# Patient Record
Sex: Female | Born: 1970 | Race: White | Hispanic: No | Marital: Single | Smoking: Current every day smoker
Health system: Southern US, Community
[De-identification: ages and names within clinical notes are randomized; demographics above are authoritative.]

## PROBLEM LIST (undated history)

## (undated) DIAGNOSIS — E785 Hyperlipidemia, unspecified: Secondary | ICD-10-CM

## (undated) DIAGNOSIS — I10 Essential (primary) hypertension: Secondary | ICD-10-CM

## (undated) HISTORY — DX: Hyperlipidemia, unspecified: E78.5

## (undated) HISTORY — PX: BREAST ENHANCEMENT SURGERY: SHX7

## (undated) HISTORY — DX: Essential (primary) hypertension: I10

## (undated) HISTORY — PX: SKIN GRAFT: SHX250

---

## 2001-03-11 ENCOUNTER — Encounter: Payer: Self-pay | Admitting: Obstetrics and Gynecology

## 2001-03-11 ENCOUNTER — Encounter: Admission: RE | Admit: 2001-03-11 | Discharge: 2001-03-11 | Payer: Self-pay | Admitting: Obstetrics and Gynecology

## 2006-10-11 ENCOUNTER — Ambulatory Visit (HOSPITAL_COMMUNITY): Admission: RE | Admit: 2006-10-11 | Discharge: 2006-10-11 | Payer: Self-pay | Admitting: Obstetrics and Gynecology

## 2012-06-09 ENCOUNTER — Ambulatory Visit: Admit: 2012-06-09 | Payer: Self-pay | Admitting: Obstetrics & Gynecology

## 2012-06-09 SURGERY — ROBOTIC ASSISTED TOTAL HYSTERECTOMY
Anesthesia: General

## 2013-05-13 ENCOUNTER — Other Ambulatory Visit: Payer: Self-pay | Admitting: Obstetrics & Gynecology

## 2013-05-13 DIAGNOSIS — N6320 Unspecified lump in the left breast, unspecified quadrant: Secondary | ICD-10-CM

## 2013-05-20 ENCOUNTER — Ambulatory Visit
Admission: RE | Admit: 2013-05-20 | Discharge: 2013-05-20 | Disposition: A | Discharge status: Home / Self Care | Payer: BC Managed Care – PPO | Source: Ambulatory Visit | Attending: Obstetrics & Gynecology | Admitting: Obstetrics & Gynecology

## 2013-05-20 DIAGNOSIS — N6320 Unspecified lump in the left breast, unspecified quadrant: Secondary | ICD-10-CM

## 2013-08-07 ENCOUNTER — Encounter (HOSPITAL_COMMUNITY): Payer: Self-pay | Admitting: Emergency Medicine

## 2013-08-07 ENCOUNTER — Emergency Department (HOSPITAL_COMMUNITY)
Admission: EM | Admit: 2013-08-07 | Discharge: 2013-08-08 | Disposition: A | Discharge status: Home / Self Care | Payer: BC Managed Care – PPO | Attending: Emergency Medicine | Admitting: Emergency Medicine

## 2013-08-07 DIAGNOSIS — M5030 Other cervical disc degeneration, unspecified cervical region: Secondary | ICD-10-CM | POA: Insufficient documentation

## 2013-08-07 DIAGNOSIS — M5382 Other specified dorsopathies, cervical region: Secondary | ICD-10-CM | POA: Insufficient documentation

## 2013-08-07 DIAGNOSIS — Z7982 Long term (current) use of aspirin: Secondary | ICD-10-CM | POA: Insufficient documentation

## 2013-08-07 DIAGNOSIS — M79609 Pain in unspecified limb: Secondary | ICD-10-CM | POA: Insufficient documentation

## 2013-08-07 DIAGNOSIS — F17200 Nicotine dependence, unspecified, uncomplicated: Secondary | ICD-10-CM | POA: Insufficient documentation

## 2013-08-07 DIAGNOSIS — R110 Nausea: Secondary | ICD-10-CM | POA: Insufficient documentation

## 2013-08-07 DIAGNOSIS — M5412 Radiculopathy, cervical region: Secondary | ICD-10-CM

## 2013-08-07 LAB — BASIC METABOLIC PANEL
BUN: 10 mg/dL (ref 6–23)
CO2: 22 mEq/L (ref 19–32)
Calcium: 9.2 mg/dL (ref 8.4–10.5)
Chloride: 101 mEq/L (ref 96–112)
Creatinine, Ser: 0.78 mg/dL (ref 0.50–1.10)
GFR calc Af Amer: >90 mL/min (ref >90)
Glucose, Bld: 112 mg/dL — ABNORMAL HIGH (ref 70–99)
Potassium: 3.8 mEq/L (ref 3.7–5.3)
Sodium: 138 mEq/L (ref 137–147)

## 2013-08-07 LAB — CBC
HEMATOCRIT: 40 % (ref 36.0–46.0)
Hemoglobin: 14.5 g/dL (ref 12.0–15.0)
MCH: 33.3 pg (ref 26.0–34.0)
MCHC: 36.3 g/dL — AB (ref 30.0–36.0)
MCV: 92 fL (ref 78.0–100.0)
Platelets: 255 10*3/uL (ref 150–400)
RBC: 4.35 MIL/uL (ref 3.87–5.11)
RDW: 12.5 % (ref 11.5–15.5)
WBC: 7.9 10*3/uL (ref 4.0–10.5)

## 2013-08-07 LAB — I-STAT TROPONIN, ED: Troponin i, poc: 0 ng/mL (ref 0.00–0.08)

## 2013-08-07 MED ORDER — DIAZEPAM 5 MG PO TABS
5.0000 mg | ORAL_TABLET | Freq: Once | ORAL | Status: AC
  Administered 2013-08-08: 5 mg via ORAL
  Filled 2013-08-07: qty 1

## 2013-08-07 MED ORDER — IBUPROFEN 200 MG PO TABS
600.0000 mg | ORAL_TABLET | Freq: Once | ORAL | Status: AC
  Administered 2013-08-08: 600 mg via ORAL
  Filled 2013-08-07 (×2): qty 1

## 2013-08-07 NOTE — ED Provider Notes (Signed)
CSN: [id number]     Arrival date & time 08/07/13  2229 History  This chart was scribed for Junius Creamer, NP, working with Orlie Dakin, MD, by [facility] ED Scribe. This patient was seen in room TR06C/TR06C and the patient's care was started at 11:36 PM.   Chief Complaint  Patient presents with  . Neck Pain  . Arm Pain    The history is provided by the patient. No language interpreter was used.    HPI Comments: Beth Moreno is a 43 y.o. female who presents to the Emergency Department complaining of intermittent, moderate "sharp" neck pain that radiates to her bilateral hands as an "aching" pain onset at 7:00 PM. She states that the pain has mostly resolved in her neck, but that it persists in her arms. She states that she has tried [place], Tylenol and baby aspirin with minimal relief. She states that movement slightly improves her pain, but that there are no other modifying factors. She denies any injury to have onset this pain, although states that she does a lot of typing. She also states that she has had associated intermittent nausea tonight due to this pain. She denies any associated numbness or tingling. She denies any history of neck injuries.Denies, diaphoresis, SOB.   History reviewed. No pertinent past medical history. History reviewed. No pertinent past surgical history. No family history on file. History  Substance Use Topics  . Smoking status: Current Every Day Smoker  . Smokeless tobacco: Not on file  . Alcohol Use: Yes   OB History   Grav Para Term Preterm Abortions TAB SAB Ect Mult Living                 Review of Systems  Constitutional: Negative for fever.  Gastrointestinal: Positive for nausea.  Musculoskeletal: Positive for neck pain (radiates to bilateral arms). Negative for back pain and joint swelling.  Skin: Negative for rash and wound.  Neurological: Negative for dizziness, weakness, light-headedness and numbness.  All other systems reviewed and are  negative.   Allergies  Review of patient's allergies indicates no known allergies.  Home Medications   Current Outpatient Rx  Name  Route  Sig  Dispense  Refill  . acetaminophen (TYLENOL) 325 MG tablet   Oral   Take 325-650 mg by mouth every 6 (six) hours as needed (pain).         Mar[place] Kitchen aspirin EC 81 MG tablet   Oral   Take 81 mg by mouth once. For pain         . diazepam (VALIUM) 5 MG tablet   Oral   Take 1 tablet (5 mg total) by mouth every 8 (eight) hours as needed for anxiety.   30 tablet   0   . naproxen sodium (ANAPROX) 550 MG tablet   Oral   Take 1 tablet (550 mg total) by mouth 3 (three) times daily with meals.   30 tablet   0     Triage Vitals: BP 143/86  Pulse 81  Temp(Src) 97.9 F (36.6 C) (Oral)  Resp 17  Wt 211 lb 2 oz (95.766 kg)  SpO2 96%  Physical Exam  Nursing note and vitals reviewed. Constitutional: She is oriented to person, place, and time. She appears well-developed and well-nourished. No distress.  HENT:  Head: Normocephalic and atraumatic.  Eyes: EOM are normal. Pupils are equal, round, and reactive to light.  Neck: Normal range of motion. Neck supple. Muscular tenderness present. No spinous process tenderness present.  No rigidity. No edema and normal range of motion present.  Cardiovascular: Normal rate.   Pulmonary/Chest: Effort normal. No respiratory distress.  Musculoskeletal: Normal range of motion. She exhibits no edema and no tenderness.  Neurological: She is alert and oriented to person, place, and time.  Skin: Skin is warm and dry.  Psychiatric: She has a normal mood and affect. Her behavior is normal.    ED Course  Procedures (including critical care time)  DIAGNOSTIC STUDIES: Oxygen Saturation is 96% on RA, normal by my interpretation.    COORDINATION OF CARE: 11:41 PM- Will order an X-ray of pt's C-spine along with medications. Pt advised of plan for treatment and pt agrees.  Medications  ibuprofen (ADVIL,MOTRIN)  tablet 600 mg (600 mg Oral Given 08/08/13 0000)  diazepam (VALIUM) tablet 5 mg (5 mg Oral Given 08/08/13 0000)   Labs Review Labs Reviewed  CBC - Abnormal; Notable for the following:    MCHC 36.3 (*)    All other components within normal limits  BASIC METABOLIC PANEL - Abnormal; Notable for the following:    Glucose, Bld 112 (*)    All other components within normal limits  I-STAT TROPOININ, ED   Imaging Review Dg Cervical Spine Complete  08/08/2013   CLINICAL DATA:  Neck pain and arm pain  EXAM: CERVICAL SPINE  4+ VIEWS  COMPARISON:  None.  FINDINGS: There is no evidence of cervical spine fracture or prevertebral soft tissue swelling. Alignment is normal. No other significant bone abnormalities are identified.  There is mild degenerative disc changes C5-6, with small endplate spurs and focal mild disc narrowing. There is no evidence of significant osseous canal or foraminal stenosis at this level.  IMPRESSION: 1.  No acute osseous findings. 2. C5-6 degenerative disc disease. No osseous foraminal stenosis to explain arm pain.   Electronically Signed   By: Jorje Guild M.D.   On: 08/08/2013 01:11     EKG Interpretation None      MDM   Final diagnoses:  Cervical radicular pain  Degenerative disc disease, cervical        I personally performed the services described in this documentation, which was scribed in my presence. The recorded information has been reviewed and is accurate.  Garald Balding, NP 08/08/13 0120

## 2013-08-07 NOTE — ED Notes (Signed)
Reports pain to both sides of neck since 7pm that radiates down arms.  L side worse than R.  Also c/o nausea.  Denies chest pain, sob.

## 2013-08-08 ENCOUNTER — Emergency Department (HOSPITAL_COMMUNITY): Payer: BC Managed Care – PPO

## 2013-08-08 MED ORDER — DIAZEPAM 5 MG PO TABS: 5.0000 mg | ORAL_TABLET | Freq: Three times a day (TID) | ORAL | Status: DC | PRN

## 2013-08-08 MED ORDER — NAPROXEN SODIUM 550 MG PO TABS: 550.0000 mg | ORAL_TABLET | Freq: Three times a day (TID) | ORAL | Status: DC

## 2013-08-08 NOTE — ED Notes (Signed)
Patient transported to X-ray 

## 2013-08-08 NOTE — Discharge Instructions (Signed)
Cervical Radiculopathy °Cervical radiculopathy means a nerve in the neck is pinched or bruised. This can cause pain or loss of feeling (numbness) that runs from your neck to your arm and fingers. °HOME CARE  °· Put ice on the injured or painful area. °· Put ice in a plastic bag. °· Place a towel between your skin and the bag. °· Leave the ice on for 15-20 minutes, 03-04 times a day, or as told by your doctor. °· If ice does not help, you can try using heat. Take a warm shower or bath, or use a hot water bottle as told by your doctor. °· You may try a gentle neck and shoulder massage. °· Use a flat pillow when you sleep. °· Only take medicines as told by your doctor. °· Keep all physical therapy visits as told by your doctor. °· If you are given a soft collar, wear it as told by your doctor. °GET HELP RIGHT AWAY IF:  °· Your pain gets worse and is not controlled with medicine. °· You lose feeling or feel weak in your hand, arm, face, or leg. °· You have a fever or stiff neck. °· You cannot control when you poop or pee (incontinence). °· You have trouble with walking, balance, or speaking. °MAKE SURE YOU:  °· Understand these instructions. °· Will watch your condition. °· Will get help right away if you are not doing well or get worse. °Document Released: 05/10/2011 Document Revised: 08/13/2011 Document Reviewed: 05/10/2011 °Exit[employer]® Patient Information ©2014 [employer]. ° °

## 2013-08-09 NOTE — ED Provider Notes (Signed)
Medical screening examination/treatment/procedure(s) were performed by non-physician practitioner and as supervising physician I was immediately available for consultation/collaboration.   Teressa Lower, MD 08/09/13 [phone]

## 2018-01-19 ENCOUNTER — Emergency Department (HOSPITAL_COMMUNITY)
Admission: EM | Admit: 2018-01-19 | Discharge: 2018-01-19 | Disposition: A | Discharge status: Home / Self Care | Payer: 59 | Attending: Emergency Medicine | Admitting: Emergency Medicine

## 2018-01-19 ENCOUNTER — Other Ambulatory Visit: Payer: Self-pay

## 2018-01-19 ENCOUNTER — Encounter (HOSPITAL_COMMUNITY): Payer: Self-pay

## 2018-01-19 ENCOUNTER — Encounter (HOSPITAL_COMMUNITY): Payer: Self-pay | Admitting: *Deleted

## 2018-01-19 DIAGNOSIS — R51 Headache: Secondary | ICD-10-CM | POA: Insufficient documentation

## 2018-01-19 DIAGNOSIS — Z5321 Procedure and treatment not carried out due to patient leaving prior to being seen by health care provider: Secondary | ICD-10-CM | POA: Diagnosis not present

## 2018-01-19 DIAGNOSIS — F17210 Nicotine dependence, cigarettes, uncomplicated: Secondary | ICD-10-CM

## 2018-01-19 NOTE — ED Triage Notes (Signed)
Pt c/o severe headache with some neck stiffness x 1 day; pt states she has taken tylenol with no relief

## 2018-01-19 NOTE — ED Notes (Signed)
Pt presented to [facility] to be registered, LWBS

## 2018-01-19 NOTE — ED Triage Notes (Signed)
Pt states that she has had a headache since yesterday, had taken multiple OTC medications without relief. Pain radiates to neck, denies fevers

## 2018-01-20 ENCOUNTER — Emergency Department (HOSPITAL_COMMUNITY): Payer: 59

## 2018-01-20 ENCOUNTER — Emergency Department (HOSPITAL_COMMUNITY)
Admission: EM | Admit: 2018-01-20 | Discharge: 2018-01-20 | Disposition: A | Discharge status: Home / Self Care | Payer: 59 | Source: Home / Self Care | Attending: Emergency Medicine | Admitting: Emergency Medicine

## 2018-01-20 DIAGNOSIS — R51 Headache: Principal | ICD-10-CM

## 2018-01-20 DIAGNOSIS — R519 Headache, unspecified: Secondary | ICD-10-CM

## 2018-01-20 LAB — CBC WITH DIFFERENTIAL/PLATELET
Basophils Absolute: 0.1 10*3/uL (ref 0.0–0.1)
Basophils Relative: 1 %
EOS PCT: 3 %
Eosinophils Absolute: 0.3 10*3/uL (ref 0.0–0.7)
HEMATOCRIT: 42.2 % (ref 36.0–46.0)
Hemoglobin: 14.8 g/dL (ref 12.0–15.0)
LYMPHS PCT: 43 %
Lymphs Abs: 4 10*3/uL (ref 0.7–4.0)
MCH: 32 pg (ref 26.0–34.0)
MCHC: 35.1 g/dL (ref 30.0–36.0)
MCV: 91.3 fL (ref 78.0–100.0)
Monocytes Absolute: 0.5 10*3/uL (ref 0.1–1.0)
Monocytes Relative: 5 %
NEUTROS ABS: 4.6 10*3/uL (ref 1.7–7.7)
Neutrophils Relative %: 48 %
PLATELETS: 244 10*3/uL (ref 150–400)
RBC: 4.62 MIL/uL (ref 3.87–5.11)
RDW: 12.5 % (ref 11.5–15.5)
WBC: 9.5 10*3/uL (ref 4.0–10.5)

## 2018-01-20 LAB — BASIC METABOLIC PANEL
ANION GAP: 10 (ref 5–15)
BUN: 9 mg/dL (ref 6–20)
CALCIUM: 9.1 mg/dL (ref 8.9–10.3)
CO2: 22 mmol/L (ref 22–32)
Chloride: 105 mmol/L (ref 98–111)
Creatinine, Ser: 0.79 mg/dL (ref 0.44–1.00)
GFR calc Af Amer: >60 mL/min (ref >60)
Glucose, Bld: 117 mg/dL — ABNORMAL HIGH (ref 70–99)
Potassium: 3.5 mmol/L (ref 3.5–5.1)
Sodium: 137 mmol/L (ref 135–145)

## 2018-01-20 MED ORDER — KETOROLAC TROMETHAMINE 30 MG/ML IJ SOLN
30.0000 mg | Freq: Once | INTRAMUSCULAR | Status: AC
  Administered 2018-01-20: 30 mg via INTRAVENOUS
  Filled 2018-01-20: qty 1

## 2018-01-20 MED ORDER — METOCLOPRAMIDE HCL 5 MG/ML IJ SOLN
10.0000 mg | Freq: Once | INTRAMUSCULAR | Status: AC
  Administered 2018-01-20: 10 mg via INTRAVENOUS
  Filled 2018-01-20: qty 2

## 2018-01-20 MED ORDER — DIPHENHYDRAMINE HCL 50 MG/ML IJ SOLN
25.0000 mg | Freq: Once | INTRAMUSCULAR | Status: AC
  Administered 2018-01-20: 25 mg via INTRAVENOUS
  Filled 2018-01-20: qty 1

## 2018-01-20 MED ORDER — IOPAMIDOL (ISOVUE-370) INJECTION 76%
100.0000 mL | Freq: Once | INTRAVENOUS | Status: AC | PRN
  Administered 2018-01-20: 100 mL via INTRAVENOUS

## 2018-01-20 NOTE — Discharge Instructions (Signed)
Your CT scan today shows no bleeding or aneurysm.  You declined lumbar puncture to rule out bleeding completely but this seems unlikely.  Establish care with a primary doctor.  Return to the ED if you develop new or worsening symptoms.

## 2018-01-20 NOTE — ED Provider Notes (Signed)
[facility] EMERGENCY DEPARTMENT Provider Note   CSN: [id number] Arrival date & time: 01/19/18  2324     History   Chief Complaint Chief Complaint  Patient presents with  . Headache    HPI Beth Moreno is a 47 y.o. female.  Patient states she woke up with diffuse neck pain on the morning of August 17.  She described a soreness of persisted for the majority of the day like she "slept on it wrong".  It seemed to improve throughout the day on August 18.  While at the grocery store in the afternoon she developed a headache that started in the back of her head and spread diffusely involving her entire head and has been persistent since.  She is taken Tylenol at home without relief.  Denies any focal weakness, numbness or tingling.  No bowel or bladder incontinence.  No photophobia, phonophobia or fever.  States she normally does not get headaches.  She has difficult time describing the onset of the headache but believes it gradually progressed and is now the worst headache she is ever had.  Hypertensive on arrival with no history of the same.  Does not take any blood thinners.  The history is provided by the patient.  Headache   Pertinent negatives include no fever, no shortness of breath, no nausea and no vomiting.    History reviewed. No pertinent past medical history.  There are no active problems to display for this patient.   History reviewed. No pertinent surgical history.   OB History   None      Home Medications    Prior to Admission medications   Medication Sig Start Date End Date Taking? Authorizing Provider  acetaminophen (TYLENOL) 325 MG tablet Take 325-650 mg by mouth every 6 (six) hours as needed (pain).    [provider]  aspirin EC 81 MG tablet Take 81 mg by mouth once. For pain    [provider]  diazepam (VALIUM) 5 MG tablet Take 1 tablet (5 mg total) by mouth every 8 (eight) hours as needed for anxiety. 08/08/13   Junius Creamer, NP  naproxen  sodium (ANAPROX) 550 MG tablet Take 1 tablet (550 mg total) by mouth 3 (three) times daily with meals. 08/08/13   Junius Creamer, NP    Family History History reviewed. No pertinent family history.  Social History Social History   Tobacco Use  . Smoking status: Current Every Day Smoker    Packs/day: 1.00    Types: Cigarettes  . Smokeless tobacco: Never Used  Substance Use Topics  . Alcohol use: Yes  . Drug use: No     Allergies   Patient has no known allergies.   Review of Systems Review of Systems  Constitutional: Negative for activity change, appetite change and fever.  HENT: Negative for congestion.   Eyes: Negative for pain and redness.  Respiratory: Negative for cough, chest tightness and shortness of breath.   Cardiovascular: Negative for chest pain.  Gastrointestinal: Negative for abdominal pain, nausea and vomiting.  Genitourinary: Negative for dysuria and hematuria.  Musculoskeletal: Negative for arthralgias and myalgias.  Skin: Negative for rash and wound.  Neurological: Positive for headaches. Negative for dizziness, weakness, light-headedness and numbness.   all other systems are negative except as noted in the HPI and PMH.     Physical Exam Updated Vital Signs BP 132/86   Pulse 79   Temp 97.9 F (36.6 C) (Oral)   Resp 17   Ht 5\' 9"  (  1.753 m)   Wt 99.8 kg   SpO2 94%   BMI 32.49 kg/m   Physical Exam  Constitutional: She is oriented to person, place, and time. She appears well-developed and well-nourished. No distress.  Smiling and joking with family members in room  HENT:  Head: Normocephalic and atraumatic.  Mouth/Throat: Oropharynx is clear and moist. No oropharyngeal exudate.  Eyes: Pupils are equal, round, and reactive to light. Conjunctivae and EOM are normal.  Neck: Normal range of motion. Neck supple.  No meningismus.  Cardiovascular: Normal rate, regular rhythm, normal heart sounds and intact distal pulses.  No murmur  heard. Pulmonary/Chest: Effort normal and breath sounds normal. No respiratory distress. She exhibits no tenderness.  Abdominal: Soft. There is no tenderness. There is no rebound and no guarding.  Musculoskeletal: Normal range of motion. She exhibits no edema or tenderness.  Neurological: She is alert and oriented to person, place, and time. No cranial nerve deficit. She exhibits normal muscle tone. Coordination normal.  CN 2-12 intact, no ataxia on finger to nose, no nystagmus, 5/5 strength throughout, no pronator drift, Romberg negative, normal gait.   Skin: Skin is warm. Capillary refill takes less than 2 seconds. No rash noted.  Psychiatric: She has a normal mood and affect. Her behavior is normal.  Nursing note and vitals reviewed.    ED Treatments / Results  Labs (all labs ordered are listed, but only abnormal results are displayed) Labs Reviewed  BASIC METABOLIC PANEL - Abnormal; Notable for the following components:      Result Value   Glucose, Bld 117 (*)    All other components within normal limits  CBC WITH DIFFERENTIAL/PLATELET    EKG None  Radiology Ct Angio Head W Or Wo Contrast  Result Date: 01/20/2018 CLINICAL DATA:  Initial evaluation for acute headache. EXAM: CT ANGIOGRAPHY HEAD AND NECK TECHNIQUE: Multidetector CT imaging of the head and neck was performed using the standard protocol during bolus administration of intravenous contrast. Multiplanar CT image reconstructions and MIPs were obtained to evaluate the vascular anatomy. Carotid stenosis measurements (when applicable) are obtained utilizing NASCET criteria, using the distal internal carotid diameter as the denominator. CONTRAST:  154mL ISOVUE-370 IOPAMIDOL (ISOVUE-370) INJECTION 76% COMPARISON:  None. FINDINGS: CT HEAD FINDINGS Brain: Cerebral volume within normal limits for patient age. No evidence for acute intracranial hemorrhage. No findings to suggest acute large vessel territory infarct. No mass lesion,  midline shift, or mass effect. Ventricles are normal in size without evidence for hydrocephalus. No extra-axial fluid collection identified. Vascular: No hyperdense vessel identified. Skull: Scalp soft tissues demonstrate no acute abnormality. Calvarium intact. Sinuses/Orbits: Globes and orbital soft tissues within normal limits. Visualized paranasal sinuses are clear. No mastoid effusion. CTA NECK FINDINGS Aortic arch: Visualized aortic arch of normal caliber with normal branch pattern. No flow-limiting stenosis about the origin of the great vessels. Subclavian arteries widely patent. Right carotid system: Right common and internal carotid arteries widely patent without stenosis, dissection, or occlusion. Left carotid system: Left common and internal carotid arteries widely patent without stenosis, dissection, or occlusion. Vertebral arteries: Both vertebral arteries arise from the subclavian arteries. Vertebral arteries widely patent within the neck without stenosis, dissection or occlusion. Skeleton: Unremarkable. Other neck: No acute soft tissue abnormality within the neck. Upper chest: Visualized chest and upper lungs demonstrate no acute abnormality. Review of the MIP images confirms the above findings CTA HEAD FINDINGS Anterior circulation: Internal carotid arteries widely patent to the termini without stenosis. Tiny 2 mm focal outpouching  arising from the cavernous left ICA felt to be a normal vascular infundibulum related to the left ophthalmic artery. A1 segments, anterior communicating artery common anterior cerebral arteries widely patent bilaterally. M1 segments widely patent. Normal MCA bifurcations. Distal MCA branches well perfused and symmetric. Posterior circulation: Vertebral arteries widely patent to the vertebrobasilar junction without stenosis. Posterior inferior cerebral arteries patent bilaterally. Basilar artery widely patent to its distal aspect. Superior cerebellar and posterior cerebral  arteries widely patent bilaterally. Venous sinuses: Patent. Anatomic variants: None significant. Delayed phase: No abnormal enhancement. Review of the MIP images confirms the above findings IMPRESSION: Normal CTA of the head and neck. Electronically Signed   By: Jeannine Boga M.D.   On: 01/20/2018 02:24   Ct Angio Neck W And/or Wo Contrast  Result Date: 01/20/2018 CLINICAL DATA:  Initial evaluation for acute headache. EXAM: CT ANGIOGRAPHY HEAD AND NECK TECHNIQUE: Multidetector CT imaging of the head and neck was performed using the standard protocol during bolus administration of intravenous contrast. Multiplanar CT image reconstructions and MIPs were obtained to evaluate the vascular anatomy. Carotid stenosis measurements (when applicable) are obtained utilizing NASCET criteria, using the distal internal carotid diameter as the denominator. CONTRAST:  155mL ISOVUE-370 IOPAMIDOL (ISOVUE-370) INJECTION 76% COMPARISON:  None. FINDINGS: CT HEAD FINDINGS Brain: Cerebral volume within normal limits for patient age. No evidence for acute intracranial hemorrhage. No findings to suggest acute large vessel territory infarct. No mass lesion, midline shift, or mass effect. Ventricles are normal in size without evidence for hydrocephalus. No extra-axial fluid collection identified. Vascular: No hyperdense vessel identified. Skull: Scalp soft tissues demonstrate no acute abnormality. Calvarium intact. Sinuses/Orbits: Globes and orbital soft tissues within normal limits. Visualized paranasal sinuses are clear. No mastoid effusion. CTA NECK FINDINGS Aortic arch: Visualized aortic arch of normal caliber with normal branch pattern. No flow-limiting stenosis about the origin of the great vessels. Subclavian arteries widely patent. Right carotid system: Right common and internal carotid arteries widely patent without stenosis, dissection, or occlusion. Left carotid system: Left common and internal carotid arteries widely  patent without stenosis, dissection, or occlusion. Vertebral arteries: Both vertebral arteries arise from the subclavian arteries. Vertebral arteries widely patent within the neck without stenosis, dissection or occlusion. Skeleton: Unremarkable. Other neck: No acute soft tissue abnormality within the neck. Upper chest: Visualized chest and upper lungs demonstrate no acute abnormality. Review of the MIP images confirms the above findings CTA HEAD FINDINGS Anterior circulation: Internal carotid arteries widely patent to the termini without stenosis. Tiny 2 mm focal outpouching arising from the cavernous left ICA felt to be a normal vascular infundibulum related to the left ophthalmic artery. A1 segments, anterior communicating artery common anterior cerebral arteries widely patent bilaterally. M1 segments widely patent. Normal MCA bifurcations. Distal MCA branches well perfused and symmetric. Posterior circulation: Vertebral arteries widely patent to the vertebrobasilar junction without stenosis. Posterior inferior cerebral arteries patent bilaterally. Basilar artery widely patent to its distal aspect. Superior cerebellar and posterior cerebral arteries widely patent bilaterally. Venous sinuses: Patent. Anatomic variants: None significant. Delayed phase: No abnormal enhancement. Review of the MIP images confirms the above findings IMPRESSION: Normal CTA of the head and neck. Electronically Signed   By: Jeannine Boga M.D.   On: 01/20/2018 02:24    Procedures Procedures (including critical care time)  Medications Ordered in ED Medications  metoCLOPramide (REGLAN) injection 10 mg (10 mg Intravenous Given 01/20/18 0111)  diphenhydrAMINE (BENADRYL) injection 25 mg (25 mg Intravenous Given 01/20/18 0111)  ketorolac (TORADOL) 30 MG/ML  injection 30 mg (30 mg Intravenous Given 01/20/18 0111)  iopamidol (ISOVUE-370) 76 % injection 100 mL (100 mLs Intravenous Contrast Given 01/20/18 0148)     Initial  Impression / Assessment and Plan / ED Course  I have reviewed the triage vital signs and the nursing notes.  Pertinent labs & imaging results that were available during my care of the patient were reviewed by me and considered in my medical decision making (see chart for details).    2 days of atraumatic neck pain now with diffuse headache.  Gradual progression but now worse headache she is ever had.  No focal weakness, numbness or tingling.  No fever.  Well-appearing with no meningismus and normal neurological exam.  Doubt meningitis.  Subarachnoid hemorrhage seems less likely as well as the headache has gradually progressed.  CTA is normal and shows no evidence of aneurysm.  No subarachnoid hemorrhage. Patient is pain-free after receiving Benadryl, Reglan and Toradol.  Blood pressure has normalized.  Discussed with patient that her work-up was reassuring.  There is no evidence of subarachnoid hemorrhage. No aneurysm seen on CTA.  Discussed with patient subarachnoid hemorrhage is theoretically possible without aneurysm.  Risks and benefits of lumbar puncture discussed with patient and she declines at this time stating she feels better and she is reassured by what has been done so far.  She understands there is a very small chance that subarachnoid hemorrhage could be missed. She appears to have capacity to understand this discussion.  She is tolerating p.o. and ambulatory. Advise followup with PCP and neurology.  Return precautions discussed.  BP 111/81   Pulse 85   Temp 97.9 F (36.6 C) (Oral)   Resp 17   Ht 5\' 9"  (1.753 m)   Wt 99.8 kg   SpO2 99%   BMI 32.49 kg/m   Final Clinical Impressions(s) / ED Diagnoses   Final diagnoses:  Bad headache    ED Discharge Orders    None       Caidan Hubbert, Annie Main, MD 01/20/18 [phone]

## 2018-01-20 NOTE — ED Notes (Signed)
Patient transported to CT 

## 2018-03-19 ENCOUNTER — Other Ambulatory Visit: Payer: Self-pay | Admitting: Physician Assistant

## 2018-03-19 DIAGNOSIS — Z1231 Encounter for screening mammogram for malignant neoplasm of breast: Secondary | ICD-10-CM

## 2018-03-21 ENCOUNTER — Ambulatory Visit: Payer: 59

## 2020-03-25 ENCOUNTER — Other Ambulatory Visit: Payer: Self-pay | Admitting: Physician Assistant

## 2020-03-25 DIAGNOSIS — Z1231 Encounter for screening mammogram for malignant neoplasm of breast: Secondary | ICD-10-CM

## 2020-05-03 ENCOUNTER — Other Ambulatory Visit: Payer: Self-pay | Admitting: Physician Assistant

## 2020-05-03 ENCOUNTER — Ambulatory Visit
Admission: RE | Admit: 2020-05-03 | Discharge: 2020-05-03 | Disposition: A | Discharge status: Home / Self Care | Payer: 59 | Source: Ambulatory Visit | Attending: Physician Assistant | Admitting: Physician Assistant

## 2020-05-03 ENCOUNTER — Other Ambulatory Visit: Payer: Self-pay

## 2020-05-03 DIAGNOSIS — Z1231 Encounter for screening mammogram for malignant neoplasm of breast: Secondary | ICD-10-CM

## 2020-10-19 ENCOUNTER — Institutional Professional Consult (permissible substitution): Payer: 59 | Admitting: Plastic Surgery

## 2021-04-07 IMAGING — MG DIGITAL SCREENING BREAST BILAT IMPLANT W/ TOMO W/ CAD
8 of 12 series · 8 of 28 positions shown · non-contrast
Comparison: Previous exam(s).

CLINICAL DATA: Screening.

EXAM:
DIGITAL SCREENING BILATERAL MAMMOGRAM WITH IMPLANTS, CAD AND TOMO
The patient has retropectoral implants. Standard and implant
displaced views were performed.

[R CC]
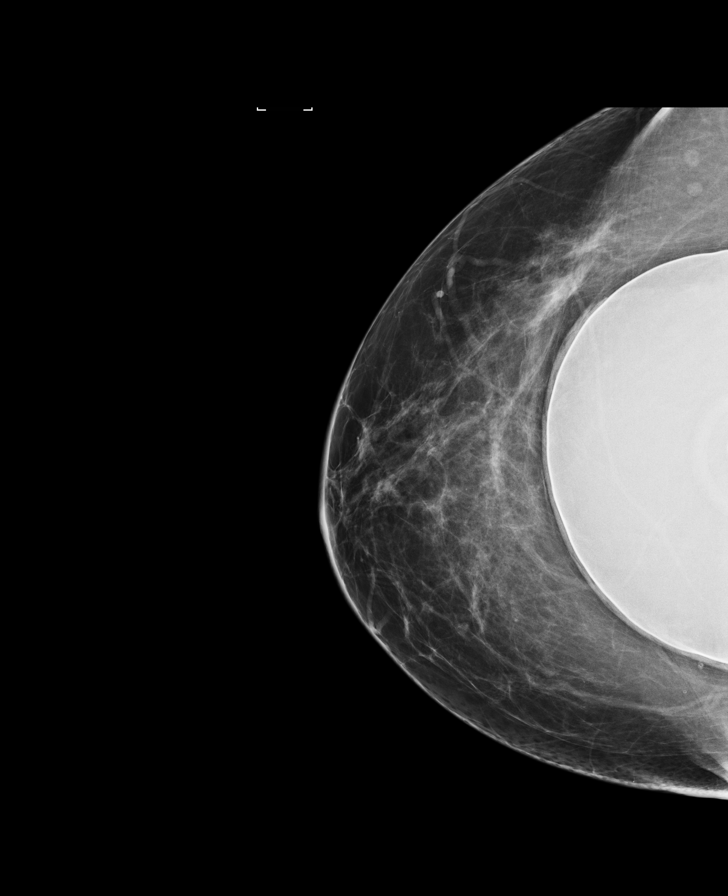

[R MLO]
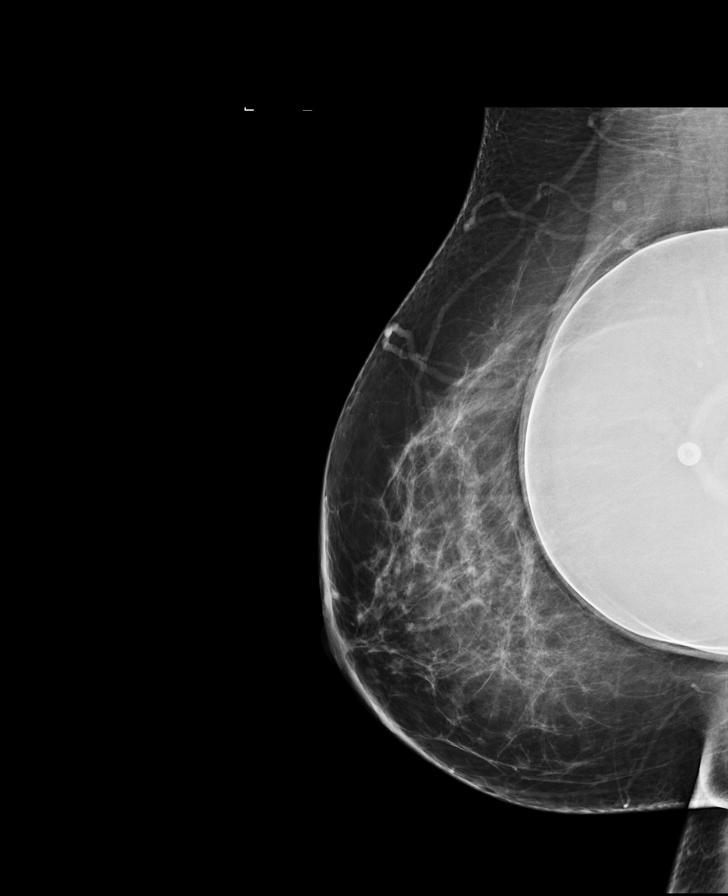

[L CC]
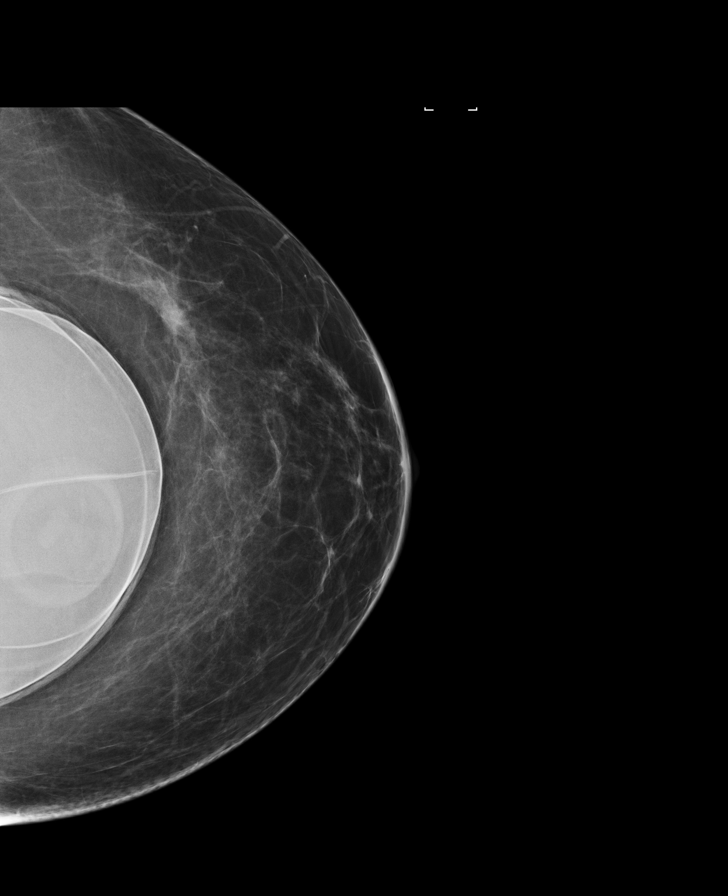

[L MLO]
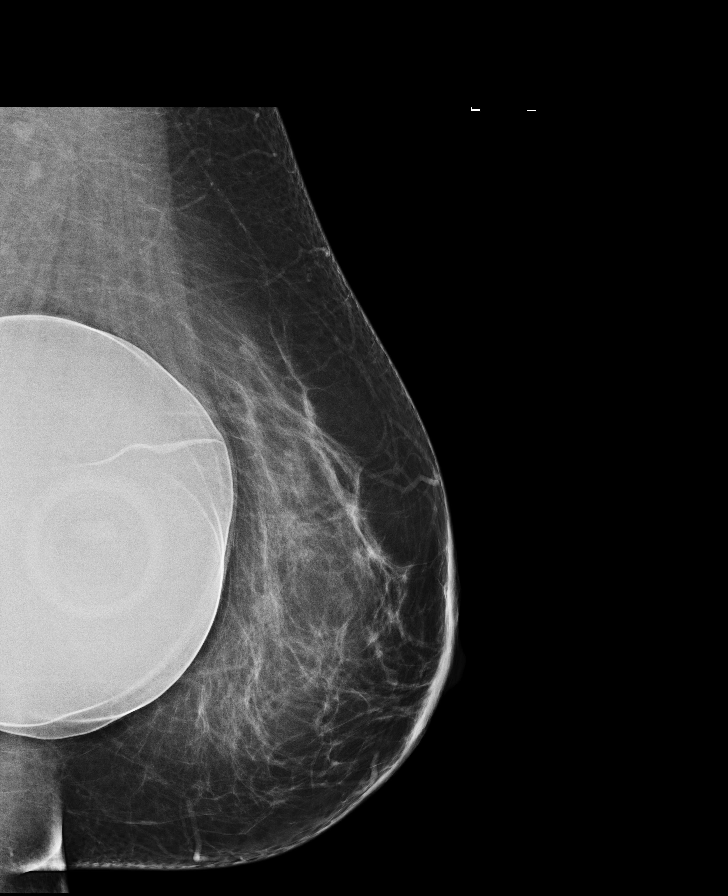

[R CC synth-2D]
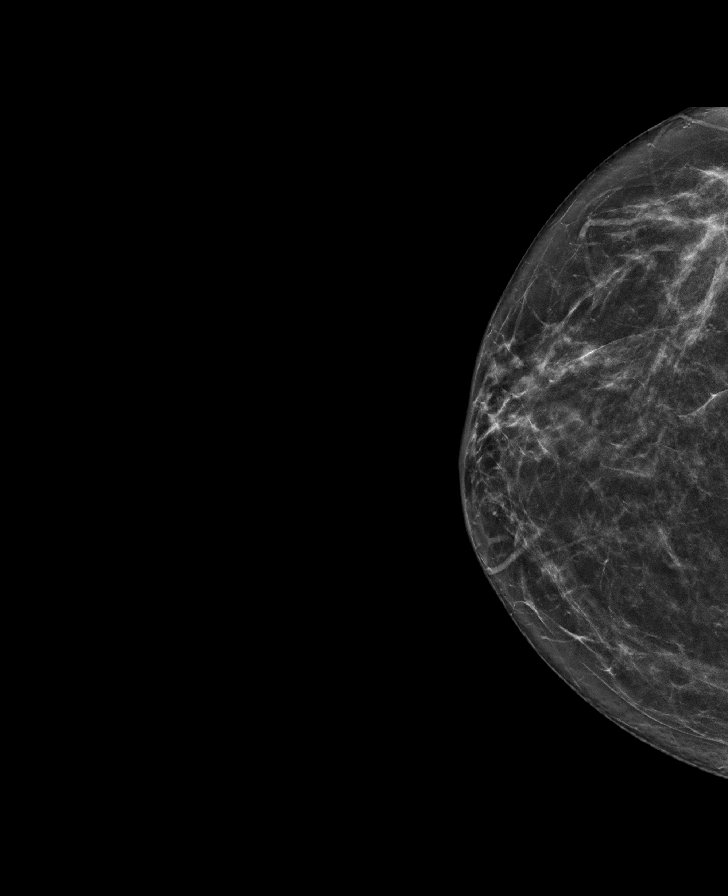

[L CC synth-2D]
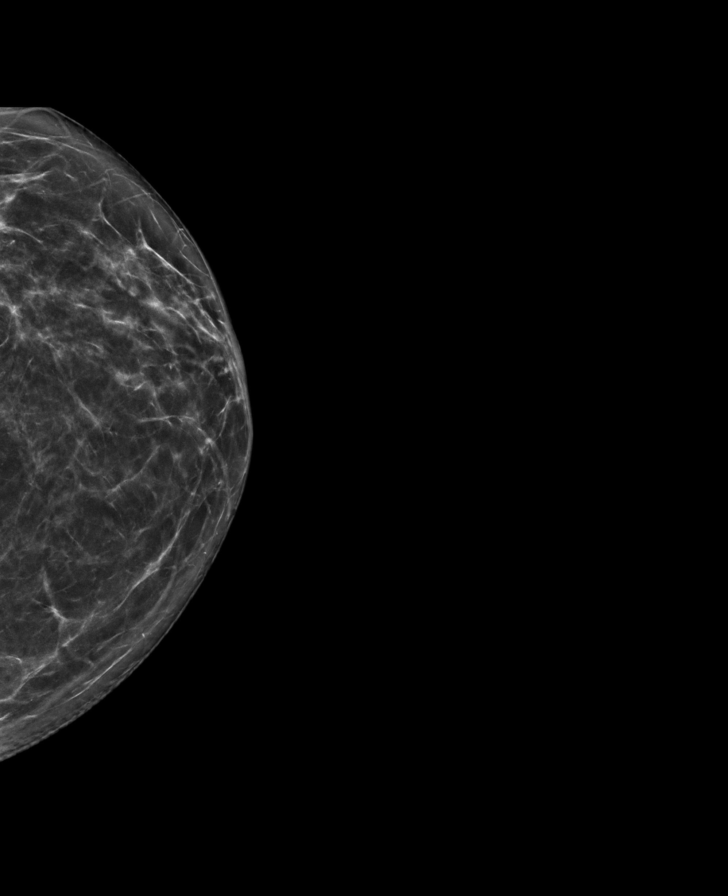

[R MLO synth-2D]
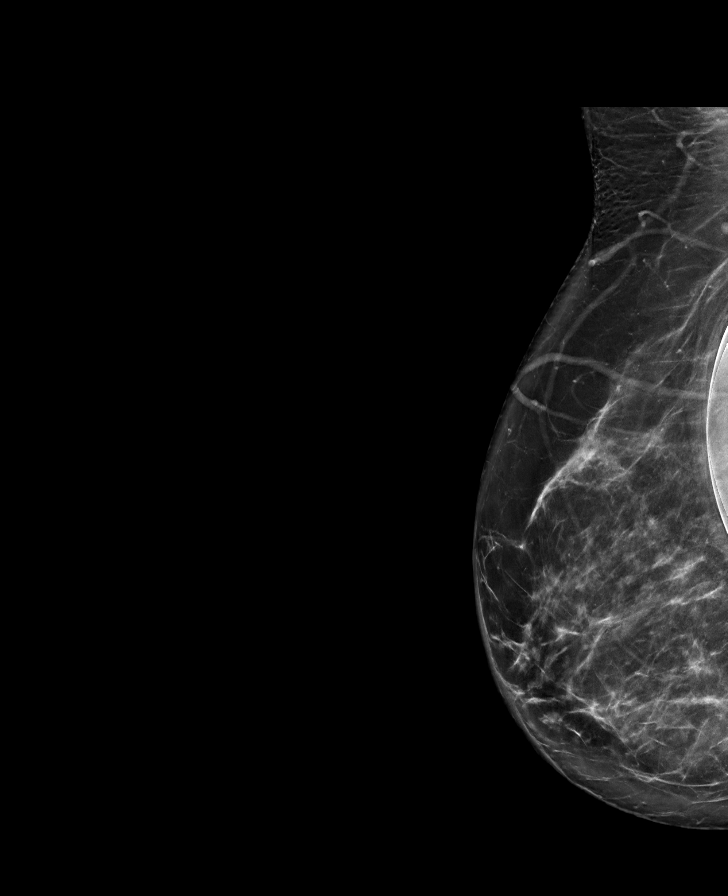

[L MLO synth-2D]
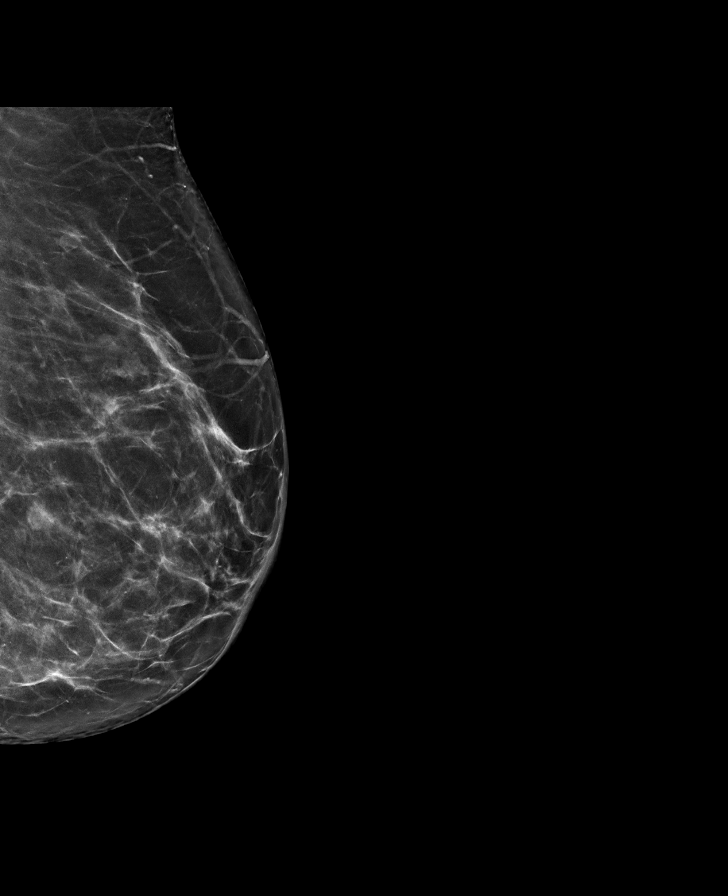

[8 of 28 positions shown; findings below may reference images not displayed]

ACR Breast Density Category b: There are scattered areas of
fibroglandular density.
FINDINGS: There are no findings suspicious for malignancy. The probable
fibroadenoma seen in the 4 o'clock position of the left breast in
4406 is significantly smaller. Images were processed with CAD.
IMPRESSION: No mammographic evidence of malignancy. A result letter of this
screening mammogram will be mailed directly to the patient.

RECOMMENDATION:
Screening mammogram in one year. (Code:M1-R-XVU)

BI-RADS CATEGORY  2: Benign.

## 2021-05-31 ENCOUNTER — Encounter: Payer: Self-pay | Admitting: Gastroenterology

## 2021-05-31 ENCOUNTER — Encounter: Payer: Self-pay | Admitting: Internal Medicine

## 2021-07-05 ENCOUNTER — Encounter: Payer: 59 | Admitting: Gastroenterology

## 2021-07-05 ENCOUNTER — Other Ambulatory Visit: Payer: Self-pay

## 2021-07-05 ENCOUNTER — Ambulatory Visit (AMBULATORY_SURGERY_CENTER): Payer: 59 | Admitting: *Deleted

## 2021-07-05 VITALS — Ht 67.0 in | Wt 240.0 lb

## 2021-07-05 DIAGNOSIS — Z1211 Encounter for screening for malignant neoplasm of colon: Secondary | ICD-10-CM

## 2021-07-05 MED ORDER — NA SULFATE-K SULFATE-MG SULF 17.5-3.13-1.6 GM/177ML PO SOLN: 1.0000 | ORAL | 0 refills | Status: DC

## 2021-07-05 NOTE — Progress Notes (Signed)
Patient's pre-visit was done today over the phone with the patient. Name,DOB and address verified. Patient denies any allergies to Eggs and Soy. Patient denies any problems with anesthesia/sedation. Patient is not taking any diet pills or blood thinners. No home Oxygen.   Prep instructions sent to pt's email & mail-pt aware. Patient understands to call us back with any questions or concerns. Patient is aware of our care-partner policy and BPPHK-[id number] safety protocol.  The patient is COVID-19 vaccinated.

## 2021-07-19 ENCOUNTER — Ambulatory Visit (AMBULATORY_SURGERY_CENTER): Payer: 59 | Admitting: Internal Medicine

## 2021-07-19 ENCOUNTER — Other Ambulatory Visit: Payer: Self-pay

## 2021-07-19 ENCOUNTER — Encounter: Payer: Self-pay | Admitting: Internal Medicine

## 2021-07-19 ENCOUNTER — Encounter: Payer: 59 | Admitting: Internal Medicine

## 2021-07-19 VITALS — BP 126/70 | HR 80 | Temp 97.8°F | Resp 15 | Ht 67.0 in | Wt 240.0 lb

## 2021-07-19 DIAGNOSIS — D124 Benign neoplasm of descending colon: Secondary | ICD-10-CM | POA: Diagnosis not present

## 2021-07-19 DIAGNOSIS — Z1211 Encounter for screening for malignant neoplasm of colon: Secondary | ICD-10-CM | POA: Diagnosis present

## 2021-07-19 DIAGNOSIS — D123 Benign neoplasm of transverse colon: Secondary | ICD-10-CM

## 2021-07-19 DIAGNOSIS — D120 Benign neoplasm of cecum: Secondary | ICD-10-CM

## 2021-07-19 MED ORDER — SODIUM CHLORIDE 0.9 % IV SOLN: 500.0000 mL | Freq: Once | INTRAVENOUS | Status: DC

## 2021-07-19 NOTE — Progress Notes (Signed)
Pt's states no medical or surgical changes since previsit or office visit. 

## 2021-07-19 NOTE — Patient Instructions (Signed)
HANDOUTS ON POLYPS & HEMORRHOIDS GIVEN TO YOU TODAY  AWAIT PATHOLOGY RESULTS ON POLYPS REMOVED    YOU HAD AN ENDOSCOPIC PROCEDURE TODAY AT [facility]:   Refer to the procedure report that was given to you for any specific questions about what was found during the examination.  If the procedure report does not answer your questions, please call your gastroenterologist to clarify.  If you requested that your care partner not be given the details of your procedure findings, then the procedure report has been included in a sealed envelope for you to review at your convenience later.  YOU SHOULD EXPECT: Some feelings of bloating in the abdomen. Passage of more gas than usual.  Walking can help get rid of the air that was put into your GI tract during the procedure and reduce the bloating. If you had a lower endoscopy (such as a colonoscopy or flexible sigmoidoscopy) you may notice spotting of blood in your stool or on the toilet paper. If you underwent a bowel prep for your procedure, you may not have a normal bowel movement for a few days.  Please Note:  You might notice some irritation and congestion in your nose or some drainage.  This is from the oxygen used during your procedure.  There is no need for concern and it should clear up in a day or so.  SYMPTOMS TO REPORT IMMEDIATELY:  Following lower endoscopy (colonoscopy or flexible sigmoidoscopy):  Excessive amounts of blood in the stool  Significant tenderness or worsening of abdominal pains  Swelling of the abdomen that is new, acute  Fever of 100F or higher  Following upper endoscopy (EGD)  Vomiting of blood or coffee ground material  New chest pain or pain under the shoulder blades  Painful or persistently difficult swallowing  New shortness of breath  Fever of 100F or higher  Black, tarry-looking stools  For urgent or emergent issues, a gastroenterologist can be reached at any hour by calling [phone]. Do  not use MyChart messaging for urgent concerns.    DIET:  We do recommend a small meal at first, but then you may proceed to your regular diet.  Drink plenty of fluids but you should avoid alcoholic beverages for 24 hours.  ACTIVITY:  You should plan to take it easy for the rest of today and you should NOT DRIVE or use heavy machinery until tomorrow (because of the sedation medicines used during the test).    FOLLOW UP: Our staff will call the number listed on your records 48-72 hours following your procedure to check on you and address any questions or concerns that you may have regarding the information given to you following your procedure. If we do not reach you, we will leave a message.  We will attempt to reach you two times.  During this call, we will ask if you have developed any symptoms of COVID 19. If you develop any symptoms (ie: fever, flu-like symptoms, shortness of breath, cough etc.) before then, please call [phone].  If you test positive for Covid 19 in the 2 weeks post procedure, please call and report this information to [place].    If any biopsies were taken you will be contacted by phone or by letter within the next 1-3 weeks.  Please call us at [phone] if you have not heard about the biopsies in 3 weeks.    SIGNATURES/CONFIDENTIALITY: You and/or your care partner have signed paperwork which will be entered  into your electronic medical record.  These signatures attest to the fact that that the information above on your After Visit Summary has been reviewed and is understood.  Full responsibility of the confidentiality of this discharge information lies with you and/or your care-partner.

## 2021-07-19 NOTE — Progress Notes (Signed)
Called to room to assist during endoscopic procedure.  Patient ID and intended procedure confirmed with present staff. Received instructions for my participation in the procedure from the performing physician.  

## 2021-07-19 NOTE — Op Note (Signed)
[facility] Patient Name: Beth Moreno Procedure Date: 07/19/2021 2:29 PM MRN: [id number] Endoscopist: Sonny Masters "Beth Moreno ,  Age: 51 Referring MD:  Date of Birth: [birth year] Gender: Female Account #: [id number] Procedure:                Colonoscopy Indications:              Screening for colorectal malignant neoplasm Medicines:                Monitored Anesthesia Care Procedure:                Pre-Anesthesia Assessment:                           - Prior to the procedure, a History and Physical                            was performed, and patient medications and                            allergies were reviewed. The patient's tolerance of                            previous anesthesia was also reviewed. The risks                            and benefits of the procedure and the sedation                            options and risks were discussed with the patient.                            All questions were answered, and informed consent                            was obtained. Prior Anticoagulants: The patient has                            taken no previous anticoagulant or antiplatelet                            agents. ASA Grade Assessment: II - A patient with                            mild systemic disease. After reviewing the risks                            and benefits, the patient was deemed in                            satisfactory condition to undergo the procedure.                           After obtaining informed consent, the colonoscope  was passed under direct vision. Throughout the                            procedure, the patient's blood pressure, pulse, and                            oxygen saturations were monitored continuously. The                            Olympus CF-HQ190L (Serial# [id number]) Colonoscope was                            introduced through the anus and advanced to the the                            terminal ileum.  The colonoscopy was performed                            without difficulty. The patient tolerated the                            procedure well. The quality of the bowel                            preparation was good. The terminal ileum, ileocecal                            valve, appendiceal orifice, and rectum were                            photographed. Scope In: 2:36:24 PM Scope Out: 2:54:20 PM Scope Withdrawal Time: 0 hours 15 minutes 7 seconds  Total Procedure Duration: 0 hours 17 minutes 56 seconds  Findings:                 The terminal ileum appeared normal.                           A 2 mm polyp was found in the cecum. The polyp was                            sessile. The polyp was removed with a cold biopsy                            forceps. Resection and retrieval were complete.                           Three sessile polyps were found in the descending                            colon and transverse colon. The polyps were 3 to 4                            mm in size. These polyps were  removed with a cold                            snare. Resection and retrieval were complete.                           Non-bleeding internal hemorrhoids were found during                            retroflexion. Complications:            No immediate complications. Estimated Blood Loss:     Estimated blood loss was minimal. Impression:               - The examined portion of the ileum was normal.                           - One 2 mm polyp in the cecum, removed with a cold                            biopsy forceps. Resected and retrieved.                           - Three 3 to 4 mm polyps in the descending colon                            and in the transverse colon, removed with a cold                            snare. Resected and retrieved.                           - Non-bleeding internal hemorrhoids. Recommendation:           - Discharge patient to home (with escort).                            - Await pathology results.                           - The findings and recommendations were discussed                            with the patient. Sonny Masters "Beth Moreno,  07/19/2021 3:02:05 PM

## 2021-07-19 NOTE — Progress Notes (Signed)
Report given to PACU, vss 
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
## 2021-07-21 ENCOUNTER — Telehealth: Payer: Self-pay

## 2021-07-21 NOTE — Telephone Encounter (Signed)
Left message on follow up call. 

## 2021-07-24 ENCOUNTER — Encounter: Payer: Self-pay | Admitting: Internal Medicine

## 2022-02-23 ENCOUNTER — Other Ambulatory Visit: Payer: Self-pay | Admitting: Physician Assistant

## 2022-02-23 DIAGNOSIS — Z1231 Encounter for screening mammogram for malignant neoplasm of breast: Secondary | ICD-10-CM

## 2022-03-23 ENCOUNTER — Ambulatory Visit: Payer: 59

## 2024-03-31 ENCOUNTER — Other Ambulatory Visit: Payer: Self-pay | Admitting: Physician Assistant

## 2024-03-31 DIAGNOSIS — Z1231 Encounter for screening mammogram for malignant neoplasm of breast: Secondary | ICD-10-CM

## 2024-05-18 ENCOUNTER — Ambulatory Visit

## 2024-05-25 ENCOUNTER — Emergency Department (HOSPITAL_COMMUNITY)

## 2024-05-25 ENCOUNTER — Encounter (HOSPITAL_COMMUNITY): Payer: Self-pay

## 2024-05-25 ENCOUNTER — Emergency Department (HOSPITAL_COMMUNITY)
Admission: EM | Admit: 2024-05-25 | Discharge: 2024-05-25 | Disposition: A | Discharge status: Home / Self Care | Attending: Emergency Medicine | Admitting: Emergency Medicine

## 2024-05-25 DIAGNOSIS — R55 Syncope and collapse: Secondary | ICD-10-CM | POA: Insufficient documentation

## 2024-05-25 DIAGNOSIS — R739 Hyperglycemia, unspecified: Secondary | ICD-10-CM

## 2024-05-25 DIAGNOSIS — R7309 Other abnormal glucose: Secondary | ICD-10-CM | POA: Diagnosis not present

## 2024-05-25 DIAGNOSIS — S0990XA Unspecified injury of head, initial encounter: Secondary | ICD-10-CM | POA: Diagnosis present

## 2024-05-25 DIAGNOSIS — S0101XA Laceration without foreign body of scalp, initial encounter: Secondary | ICD-10-CM | POA: Insufficient documentation

## 2024-05-25 DIAGNOSIS — R059 Cough, unspecified: Secondary | ICD-10-CM | POA: Diagnosis not present

## 2024-05-25 DIAGNOSIS — Z23 Encounter for immunization: Secondary | ICD-10-CM | POA: Diagnosis not present

## 2024-05-25 DIAGNOSIS — S40021A Contusion of right upper arm, initial encounter: Secondary | ICD-10-CM | POA: Insufficient documentation

## 2024-05-25 DIAGNOSIS — W19XXXA Unspecified fall, initial encounter: Secondary | ICD-10-CM | POA: Diagnosis not present

## 2024-05-25 DIAGNOSIS — Z79899 Other long term (current) drug therapy: Secondary | ICD-10-CM | POA: Diagnosis not present

## 2024-05-25 LAB — CBC WITH DIFFERENTIAL/PLATELET
Abs Immature Granulocytes: 0.13 K/uL — ABNORMAL HIGH (ref 0.00–0.07)
Basophils Absolute: 0.1 K/uL (ref 0.0–0.1)
Basophils Relative: 1 %
Eosinophils Absolute: 0.4 K/uL (ref 0.0–0.5)
Eosinophils Relative: 3 %
HCT: 36.9 % (ref 36.0–46.0)
Hemoglobin: 12.6 g/dL (ref 12.0–15.0)
Immature Granulocytes: 1 %
Lymphocytes Relative: 35 %
Lymphs Abs: 3.8 K/uL (ref 0.7–4.0)
MCH: 31.1 pg (ref 26.0–34.0)
MCHC: 34.1 g/dL (ref 30.0–36.0)
MCV: 91.1 fL (ref 80.0–100.0)
Monocytes Absolute: 0.6 K/uL (ref 0.1–1.0)
Monocytes Relative: 5 %
Neutro Abs: 5.9 K/uL (ref 1.7–7.7)
Neutrophils Relative %: 55 %
Platelets: 323 K/uL (ref 150–400)
RBC: 4.05 MIL/uL (ref 3.87–5.11)
RDW: 12.6 % (ref 11.5–15.5)
WBC: 10.8 K/uL — ABNORMAL HIGH (ref 4.0–10.5)
nRBC: 0 % (ref 0.0–0.2)

## 2024-05-25 LAB — BASIC METABOLIC PANEL WITH GFR
Anion gap: 10 (ref 5–15)
BUN: 6 mg/dL (ref 6–20)
CO2: 28 mmol/L (ref 22–32)
Calcium: 8.9 mg/dL (ref 8.9–10.3)
Chloride: 103 mmol/L (ref 98–111)
Creatinine, Ser: 0.76 mg/dL (ref 0.44–1.00)
GFR, Estimated: >60 mL/min
Glucose, Bld: 113 mg/dL — ABNORMAL HIGH (ref 70–99)
Potassium: 3.6 mmol/L (ref 3.5–5.1)
Sodium: 140 mmol/L (ref 135–145)

## 2024-05-25 LAB — TROPONIN T, HIGH SENSITIVITY: Troponin T High Sensitivity: <15 ng/L (ref 0–19)

## 2024-05-25 MED ORDER — DEXAMETHASONE 4 MG PO TABS
10.0000 mg | ORAL_TABLET | Freq: Once | ORAL | Status: AC
  Administered 2024-05-25: 10 mg via ORAL
  Filled 2024-05-25: qty 3

## 2024-05-25 MED ORDER — IPRATROPIUM-ALBUTEROL 0.5-2.5 (3) MG/3ML IN SOLN: 3.0000 mL | Freq: Once | RESPIRATORY_TRACT | Status: DC

## 2024-05-25 MED ORDER — TETANUS-DIPHTH-ACELL PERTUSSIS 5-2-15.5 LF-MCG/0.5 IM SUSP
0.5000 mL | Freq: Once | INTRAMUSCULAR | Status: AC
  Administered 2024-05-25: 0.5 mL via INTRAMUSCULAR
  Filled 2024-05-25: qty 0.5

## 2024-05-25 NOTE — ED Provider Notes (Signed)
 " [place] EMERGENCY DEPARTMENT AT [facility] Provider Note   CSN: [id number] Arrival date & time: 05/25/24  0225     Patient presents with: Fall and Laceration (Head/)   Beth Moreno is a 53 y.o. female.   The history is provided by the spouse.  Fall  Laceration  She has history of hypertension, hyperlipidemia and comes in because of a syncopal episode where she hit her head and suffered a laceration to the left side of the occiput.  She states that she has been having a cough which is mostly nonproductive.  Cough started about 2 weeks ago.  Over the past week, she has had episodes of syncope with coughing paroxysms.  She denies chest pain, heaviness, tightness, pressure.  She denies any palpitations.  She did suffer a bruise to her right upper arm with one of the episodes of syncope.  She denies fever or chills and denies nausea vomiting.  She has been using telehealth and had been prescribed albuterol  inhaler, benzonatate, over-the-counter cough medicine.  2 days ago, she was also prescribed amoxicillin-clavulanate.  Last tetanus immunization was about 10 years ago.    Prior to Admission medications  Medication Sig Start Date End Date Taking? Authorizing Provider  CALCIUM PO Take by mouth.    [provider]  losartan (COZAAR) 50 MG tablet Take 50 mg by mouth daily. 05/02/21   [provider]  Omega-3 Fatty Acids (FISH OIL PO) Take 1,200 mg by mouth daily.    [provider]  rosuvastatin (CRESTOR) 20 MG tablet Take 20 mg by mouth daily. 04/21/21   [provider]    Allergies: Patient has no known allergies.    Review of Systems  All other systems reviewed and are negative.   Updated Vital Signs BP 134/73   Pulse 91   Temp 98.1 F (36.7 C) (Oral)   Resp 18   SpO2 92%   Physical Exam Vitals and nursing note reviewed.   53 year old female, resting comfortably and in no acute distress. Vital signs are normal. Oxygen  saturation is 92%, which is normal. Head is normocephalic.  There is a laceration in the left occipital area. PERRLA, EOMI. Oropharynx is clear. Neck is nontender and supple without adenopathy. Lungs some fine rales in the right mid lung fields posteriorly.  There are no wheezes or rhonchi. Chest is nontender. Heart has regular rate and rhythm without murmur. Abdomen is soft, flat, nontender. Extremities have no cyanosis or edema, full range of motion is present.  Ecchymosis is noted in the posterior aspect of the right upper arm. Skin is warm and dry without rash. Neurologic: Mental status is normal, cranial nerves are intact, moves all extremities equally.  (all labs ordered are listed, but only abnormal results are displayed) Labs Reviewed  BASIC METABOLIC PANEL WITH GFR - Abnormal; Notable for the following components:      Result Value   Glucose, Bld 113 (*)    All other components within normal limits  CBC WITH DIFFERENTIAL/PLATELET - Abnormal; Notable for the following components:   WBC 10.8 (*)    Abs Immature Granulocytes 0.13 (*)    All other components within normal limits  TROPONIN T, HIGH SENSITIVITY    EKG: EKG Interpretation Date/Time:  Monday May 25 2024 04:50:45 EST Ventricular Rate:  99 PR Interval:  141 QRS Duration:  91 QT Interval:  367 QTC Calculation: 471 R Axis:   68  Text Interpretation: Sinus rhythm Normal ECG  When compared with ECG of 08/07/2013, No significant change was found Confirmed by Raford Lenis ([id number]) on 05/25/2024 6:11:21 AM  Radiology: DG Chest 2 View Result Date: 05/25/2024 EXAM: 2 VIEW(S) XRAY OF THE CHEST 05/25/2024 05:06:03 AM COMPARISON: None available. CLINICAL HISTORY: cough FINDINGS: LUNGS AND PLEURA: No focal pulmonary opacity. No pleural effusion. No pneumothorax. HEART AND MEDIASTINUM: No acute abnormality of the cardiac and mediastinal silhouettes. BONES AND SOFT TISSUES: No acute osseous abnormality. IMPRESSION: 1. No acute  cardiopulmonary process. Electronically signed by: Evalene Coho MD 05/25/2024 05:30 AM EST RP Workstation: [id number]   CT Head Wo Contrast Result Date: 05/25/2024 EXAM: CT HEAD AND CERVICAL SPINE 05/25/2024 05:19:07 AM TECHNIQUE: CT of the head and cervical spine was performed without the administration of intravenous contrast. Multiplanar reformatted images are provided for review. Automated exposure control, iterative reconstruction, and/or weight based adjustment of the mA/kV was utilized to reduce the radiation dose to as low as reasonably achievable. COMPARISON: None available. CLINICAL HISTORY: Head trauma, moderate-severe Head trauma, moderate-severe FINDINGS: CT HEAD BRAIN AND VENTRICLES: No acute intracranial hemorrhage. No mass effect or midline shift. No abnormal extra-axial fluid collection. No evidence of acute infarct. No hydrocephalus. ORBITS: No acute abnormality. SINUSES AND MASTOIDS: No acute abnormality. SOFT TISSUES AND SKULL: No acute skull fracture. No acute soft tissue abnormality. CT CERVICAL SPINE BONES AND ALIGNMENT: No acute fracture or traumatic malalignment. DEGENERATIVE CHANGES: Moderate C5-C6 degenerative disc disease with disc height loss and endplate spurring. Right-sided facet arthropathy at C3-C4. SOFT TISSUES: No prevertebral soft tissue swelling. IMPRESSION: 1. No acute intracranial abnormality. 2. No acute fracture or traumatic malalignment of the cervical spine. Electronically signed by: Gilmore Molt 05/25/2024 05:30 AM EST RP Workstation: [id number]   CT Cervical Spine Wo Contrast Result Date: 05/25/2024 EXAM: CT HEAD AND CERVICAL SPINE 05/25/2024 05:19:07 AM TECHNIQUE: CT of the head and cervical spine was performed without the administration of intravenous contrast. Multiplanar reformatted images are provided for review. Automated exposure control, iterative reconstruction, and/or weight based adjustment of the mA/kV was utilized to reduce the radiation dose  to as low as reasonably achievable. COMPARISON: None available. CLINICAL HISTORY: Head trauma, moderate-severe Head trauma, moderate-severe FINDINGS: CT HEAD BRAIN AND VENTRICLES: No acute intracranial hemorrhage. No mass effect or midline shift. No abnormal extra-axial fluid collection. No evidence of acute infarct. No hydrocephalus. ORBITS: No acute abnormality. SINUSES AND MASTOIDS: No acute abnormality. SOFT TISSUES AND SKULL: No acute skull fracture. No acute soft tissue abnormality. CT CERVICAL SPINE BONES AND ALIGNMENT: No acute fracture or traumatic malalignment. DEGENERATIVE CHANGES: Moderate C5-C6 degenerative disc disease with disc height loss and endplate spurring. Right-sided facet arthropathy at C3-C4. SOFT TISSUES: No prevertebral soft tissue swelling. IMPRESSION: 1. No acute intracranial abnormality. 2. No acute fracture or traumatic malalignment of the cervical spine. Electronically signed by: Gilmore Molt 05/25/2024 05:30 AM EST RP Workstation: [id number]     .Laceration Repair  Date/Time: 05/25/2024 7:50 AM  Performed by: Raford Lenis, MD Authorized by: Raford Lenis, MD   Consent:    Consent obtained:  Verbal   Consent given by:  Patient   Risks, benefits, and alternatives were discussed: yes     Risks discussed:  Infection and pain   Alternatives discussed:  No treatment Universal protocol:    Procedure explained and questions answered to patient or proxy's satisfaction: yes     Relevant documents present and verified: yes     Test results available: yes     Imaging studies available: yes  Required blood products, implants, devices, and special equipment available: yes     Site/side marked: yes     Immediately prior to procedure, a time out was called: yes     Patient identity confirmed:  Verbally with patient and arm band Anesthesia:    Anesthesia method:  None Laceration details:    Location:  Scalp   Scalp location:  Occipital   Length (cm):  4   Depth  (mm):  3 Pre-procedure details:    Preparation:  Patient was prepped and draped in usual sterile fashion and imaging obtained to evaluate for foreign bodies Exploration:    Limited defect created (wound extended): no     Hemostasis achieved with:  Direct pressure   Imaging obtained: x-ray     Imaging outcome: foreign body not noted     Wound extent: no foreign body and no underlying fracture     Contaminated: no   Treatment:    Area cleansed with:  Saline   Amount of cleaning:  Standard   Debridement:  None   Undermining:  None   Scar revision: no   Skin repair:    Repair method:  Staples   Number of staples:  8 Approximation:    Approximation:  Close Repair type:    Repair type:  Simple Post-procedure details:    Dressing:  Open (no dressing)   Procedure completion:  Tolerated well, no immediate complications    Medications Ordered in the ED  dexamethasone  (DECADRON ) tablet 10 mg (has no administration in time range)  Tdap (ADACEL ) injection 0.5 mL (0.5 mLs Intramuscular Given 05/25/24 0516)                                    Medical Decision Making Amount and/or Complexity of Data Reviewed Labs: ordered. Radiology: ordered.  Risk Prescription drug management.   Scalp laceration from syncopal episode.  Clinically, syncope does appear to be posttussive, low index of suspicion for arrhythmia, valvular heart disease.  However, this does require investigation they have ordered electrocardiogram as well as screening labs.  Because of head trauma, I have ordered CT of the head and cervical spine.  I have ordered electrocardiogram.  I ordered Tdap booster.  She will need staple closure of the laceration.  I have reviewed laboratory tests, my interpretation is elevated random glucose level and otherwise normal metabolic panel, mild leukocytosis which is nonspecific, normal troponin.  CT of head and cervical spine showed no acute process, chest x-ray shows no acute  cardiopulmonary process.  I independently viewed all the images, and agree with the radiologist's interpretation.  I reviewed her electrocardiogram, and my interpretation is normal ECG.  I have closed the laceration with staples.  I have ordered a dose of dexamethasone  to try to suppress the inflammation behind cough.  I am discharging her with with instructions to have staples removed in 7 days.     Final diagnoses:  Cough syncope  Laceration of occipital scalp, initial encounter  Elevated random blood glucose level    ED Discharge Orders     None          Raford Lenis, MD 05/25/24 [phone]  "

## 2024-05-25 NOTE — Discharge Instructions (Signed)
 When you start having a coughing spasm, make sure that she get to a place where you can sit down or lay down until it passes.  You may take acetaminophen and/or ibuprofen  as needed for pain.  Staples need to be removed in 7 days.

## 2024-05-25 NOTE — ED Notes (Signed)
Cup of water provided to patient.

## 2024-05-25 NOTE — ED Notes (Signed)
Urine in room if needed.

## 2024-05-25 NOTE — ED Notes (Signed)
 Patient transported to CT

## 2024-05-25 NOTE — ED Triage Notes (Signed)
 Brought by ambulance. Coming from home, multiple falls on the past week, four times total.  Today pt fell and caused a 2inch laceration on the occipital area.  Falls related to sever coughing spells, that doesn't let pt takes breath until, pt falls down.   Pt described cough as dry, with hoarse voice, Aox 4, pain 5 out of 10.

## 2024-06-02 ENCOUNTER — Emergency Department (HOSPITAL_COMMUNITY)
Admission: EM | Admit: 2024-06-02 | Discharge: 2024-06-02 | Disposition: A | Discharge status: Home / Self Care | Attending: Emergency Medicine | Admitting: Emergency Medicine

## 2024-06-02 ENCOUNTER — Encounter (HOSPITAL_COMMUNITY): Payer: Self-pay | Admitting: Emergency Medicine

## 2024-06-02 ENCOUNTER — Other Ambulatory Visit: Payer: Self-pay

## 2024-06-02 DIAGNOSIS — R000 Tachycardia, unspecified: Secondary | ICD-10-CM | POA: Insufficient documentation

## 2024-06-02 DIAGNOSIS — I10 Essential (primary) hypertension: Secondary | ICD-10-CM | POA: Insufficient documentation

## 2024-06-02 DIAGNOSIS — Z4802 Encounter for removal of sutures: Secondary | ICD-10-CM | POA: Insufficient documentation

## 2024-06-02 NOTE — ED Triage Notes (Signed)
 Pt presents for suture removal of posterior scalp and medication for cough.

## 2024-06-02 NOTE — ED Notes (Signed)
 Pt verbalizes she is requesting a refill of her Augmentin prescription as well for her on-going cough and congestion.

## 2024-06-02 NOTE — ED Notes (Signed)
 ED Provider at bedside.

## 2024-06-02 NOTE — ED Provider Notes (Signed)
 " [facility] AT [facility] Provider Note   CSN: [id number] Arrival date & time: 06/02/24  1032     Patient presents with: Suture / Staple Removal and Cough   Beth Moreno is a 53 y.o. female with a history including hypertension and hyperlipidemia who is currently recovering from a prolonged upper respiratory infection with persistent cough, had been seen for this on 1222 at which time she had an episode of cough syncope causing laceration to her posterior scalp.  She is here today for staple removal.  She denies any residual symptoms or concerns regarding her scalp laceration.  She does continue to have a significant cough, but was started on a course of prednisone which she just started yesterday.  No fevers or chills, no shortness of breath.   The history is provided by the patient.       Prior to Admission medications  Medication Sig Start Date End Date Taking? Authorizing Provider  amoxicillin-clavulanate (AUGMENTIN) 875-125 MG tablet SMARTSIG:1 Tablet(s) By Mouth Every 12 Hours   Yes [provider]  CALCIUM PO Take by mouth.    [provider]  losartan (COZAAR) 50 MG tablet Take 50 mg by mouth daily. 05/02/21   [provider]  Omega-3 Fatty Acids (FISH OIL PO) Take 1,200 mg by mouth daily.    [provider]  rosuvastatin (CRESTOR) 20 MG tablet Take 20 mg by mouth daily. 04/21/21   [provider]    Allergies: Patient has no known allergies.    Review of Systems  Constitutional:  Negative for fever.  HENT:  Negative for congestion and sore throat.   Eyes: Negative.   Respiratory:  Positive for cough. Negative for chest tightness and shortness of breath.   Cardiovascular:  Negative for chest pain.  Gastrointestinal:  Negative for abdominal pain and nausea.  Genitourinary: Negative.   Musculoskeletal:  Negative for arthralgias, joint swelling and neck pain.  Skin:  Positive for wound. Negative for  rash.  Neurological:  Negative for dizziness, weakness, light-headedness, numbness and headaches.  Psychiatric/Behavioral: Negative.      Updated Vital Signs BP 133/84   Pulse 96   Temp 98.3 F (36.8 C) (Oral)   Resp 20   Ht 5' 7 (1.702 m)   Wt 108.9 kg   SpO2 93%   BMI 37.60 kg/m   Physical Exam Constitutional:      Appearance: She is well-developed.  HENT:     Head: Normocephalic.  Cardiovascular:     Rate and Rhythm: Normal rate.  Pulmonary:     Effort: Pulmonary effort is normal. No respiratory distress.     Breath sounds: No wheezing or rhonchi.  Skin:    Findings: Laceration present.     Comments: Well-healed occipital scalp laceration, staples in place.  Neurological:     Mental Status: She is alert and oriented to person, place, and time.     Sensory: No sensory deficit.     (all labs ordered are listed, but only abnormal results are displayed) Labs Reviewed - No data to display  EKG: None  Radiology: No results found.   Procedures   SUTURE REMOVAL Performed by: Mliss Narrow  Consent: Verbal consent obtained. Consent given by: patient Required items: required blood products, implants, devices, and special equipment available Time out: Immediately prior to procedure a time out was called to verify the correct patient, procedure, equipment, support staff and site/side marked as required.  Location: Occipital scalp  Wound  Appearance: clean  Sutures/Staples Removed: 8  Patient tolerance: Patient tolerated the procedure well with no immediate complications.   Medications Ordered in the ED - No data to display                                  Medical Decision Making Patient with a well-healed occipital laceration, she tolerated staple removal without complication.  She but can was advised to allow the scab to fall away naturally fully wash her hair at this point in time.  Her lung exam is reassuring.  Initially she presented with tachycardia  which patient states is a side effect of being in the hospital.  A recheck pulse rate was 96.  She was encouraged to complete the prednisone course which should make a big difference regarding this chronic cough.  She has no respiratory distress during today's visit.        Final diagnoses:  Removal of staples    ED Discharge Orders     None          Yahsir Wickens, PA-C 06/02/24 1156    Melvenia Motto, MD 06/02/24 1643  "
